# Patient Record
Sex: Female | Born: 1950
Health system: Southern US, Community
[De-identification: ages and names within clinical notes are randomized; demographics above are authoritative.]

## PROBLEM LIST (undated history)

## (undated) DIAGNOSIS — E119 Type 2 diabetes mellitus without complications: Secondary | ICD-10-CM

## (undated) DIAGNOSIS — K219 Gastro-esophageal reflux disease without esophagitis: Secondary | ICD-10-CM

## (undated) HISTORY — PX: EXPLORATORY LAPAROTOMY: SUR591

## (undated) HISTORY — PX: ABDOMINAL HYSTERECTOMY: SHX81

## (undated) HISTORY — PX: REPLACEMENT TOTAL KNEE BILATERAL: SUR1225

## (undated) HISTORY — PX: JOINT REPLACEMENT: SHX530

## (undated) HISTORY — PX: NEPHRECTOMY: SHX65

## (undated) HISTORY — PX: HERNIA REPAIR: SHX51

---

## 2004-11-17 ENCOUNTER — Ambulatory Visit: Payer: Self-pay | Admitting: Orthopaedic Surgery

## 2007-02-13 ENCOUNTER — Ambulatory Visit: Payer: Self-pay | Admitting: Orthopaedic Surgery

## 2007-02-14 ENCOUNTER — Ambulatory Visit: Payer: Self-pay | Admitting: Orthopaedic Surgery

## 2007-08-19 ENCOUNTER — Ambulatory Visit: Payer: Self-pay | Admitting: Orthopaedic Surgery

## 2007-08-27 ENCOUNTER — Inpatient Hospital Stay: Payer: Self-pay | Admitting: Orthopaedic Surgery

## 2008-06-27 IMAGING — CR DG KNEE 1-2V*R*
1 series · 2 of 2 positions shown · non-contrast
Comparison: none

REASON FOR EXAM: post op TKR
COMMENTS:   Bedside (portable):Y

[Series 1: view not recorded · 0.17mm/px · 2 of 2 slices shown]
[im 1/2]
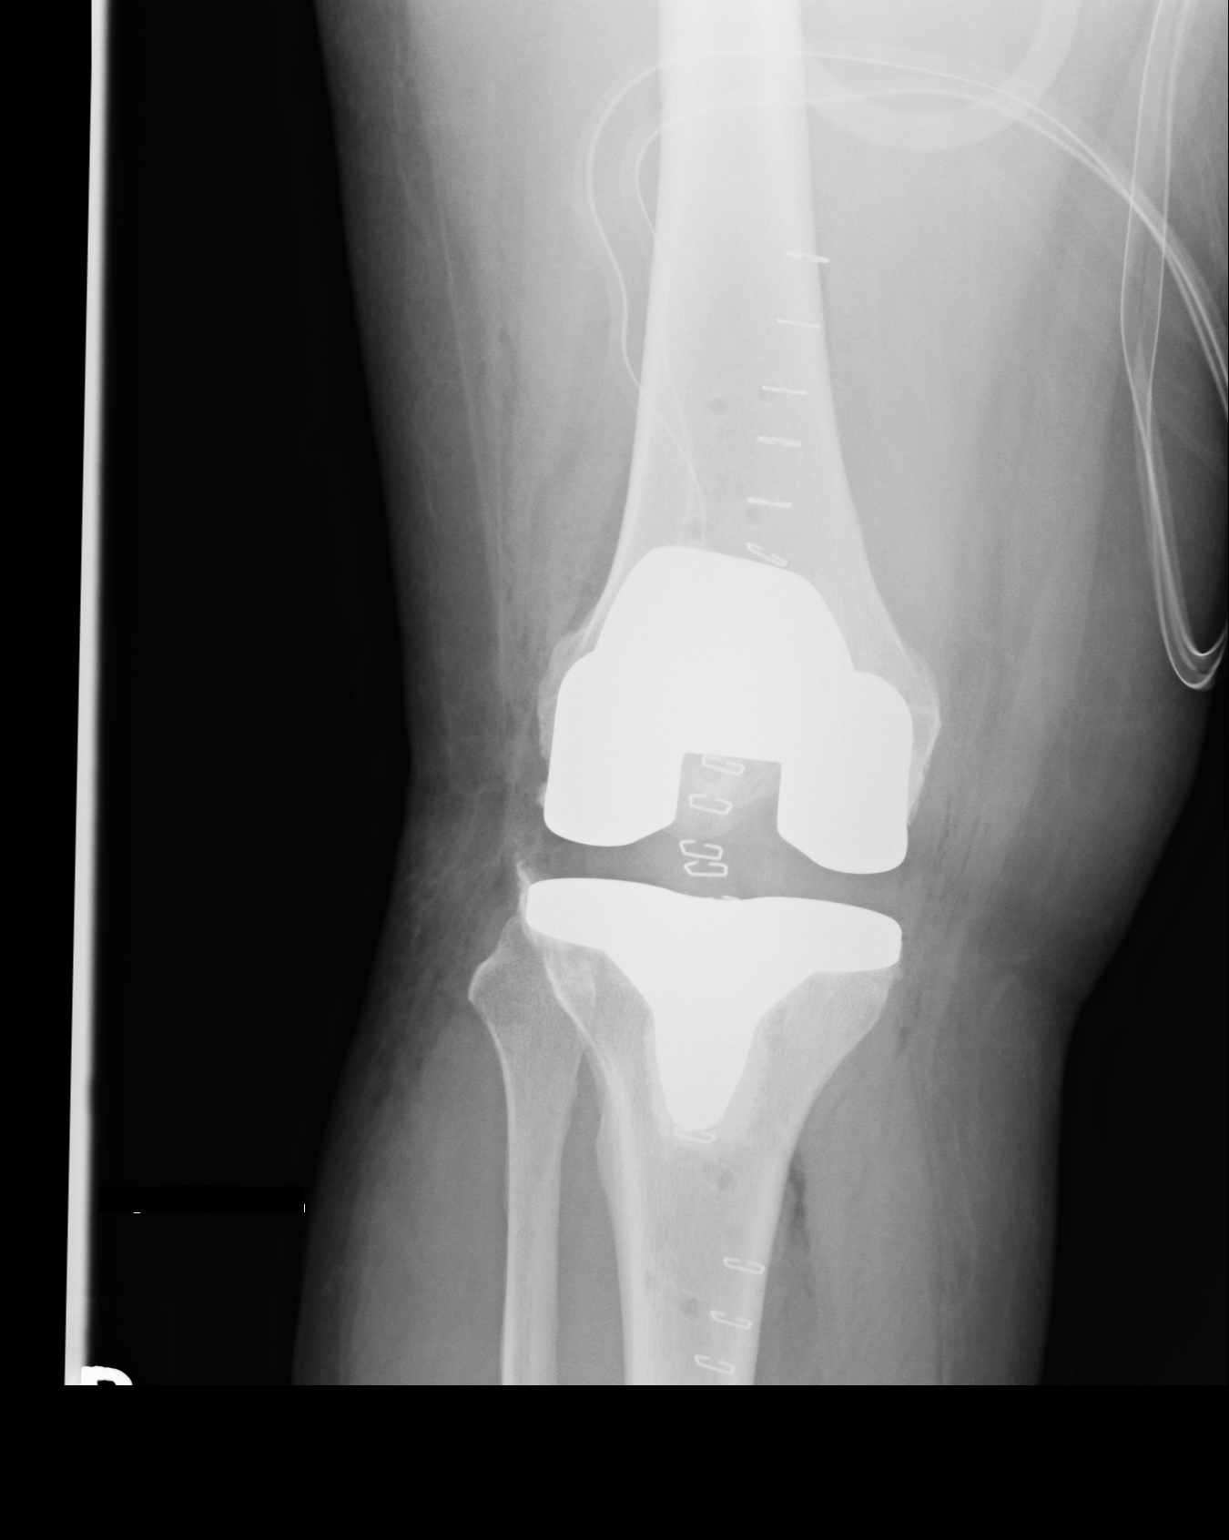
[im 2/2]
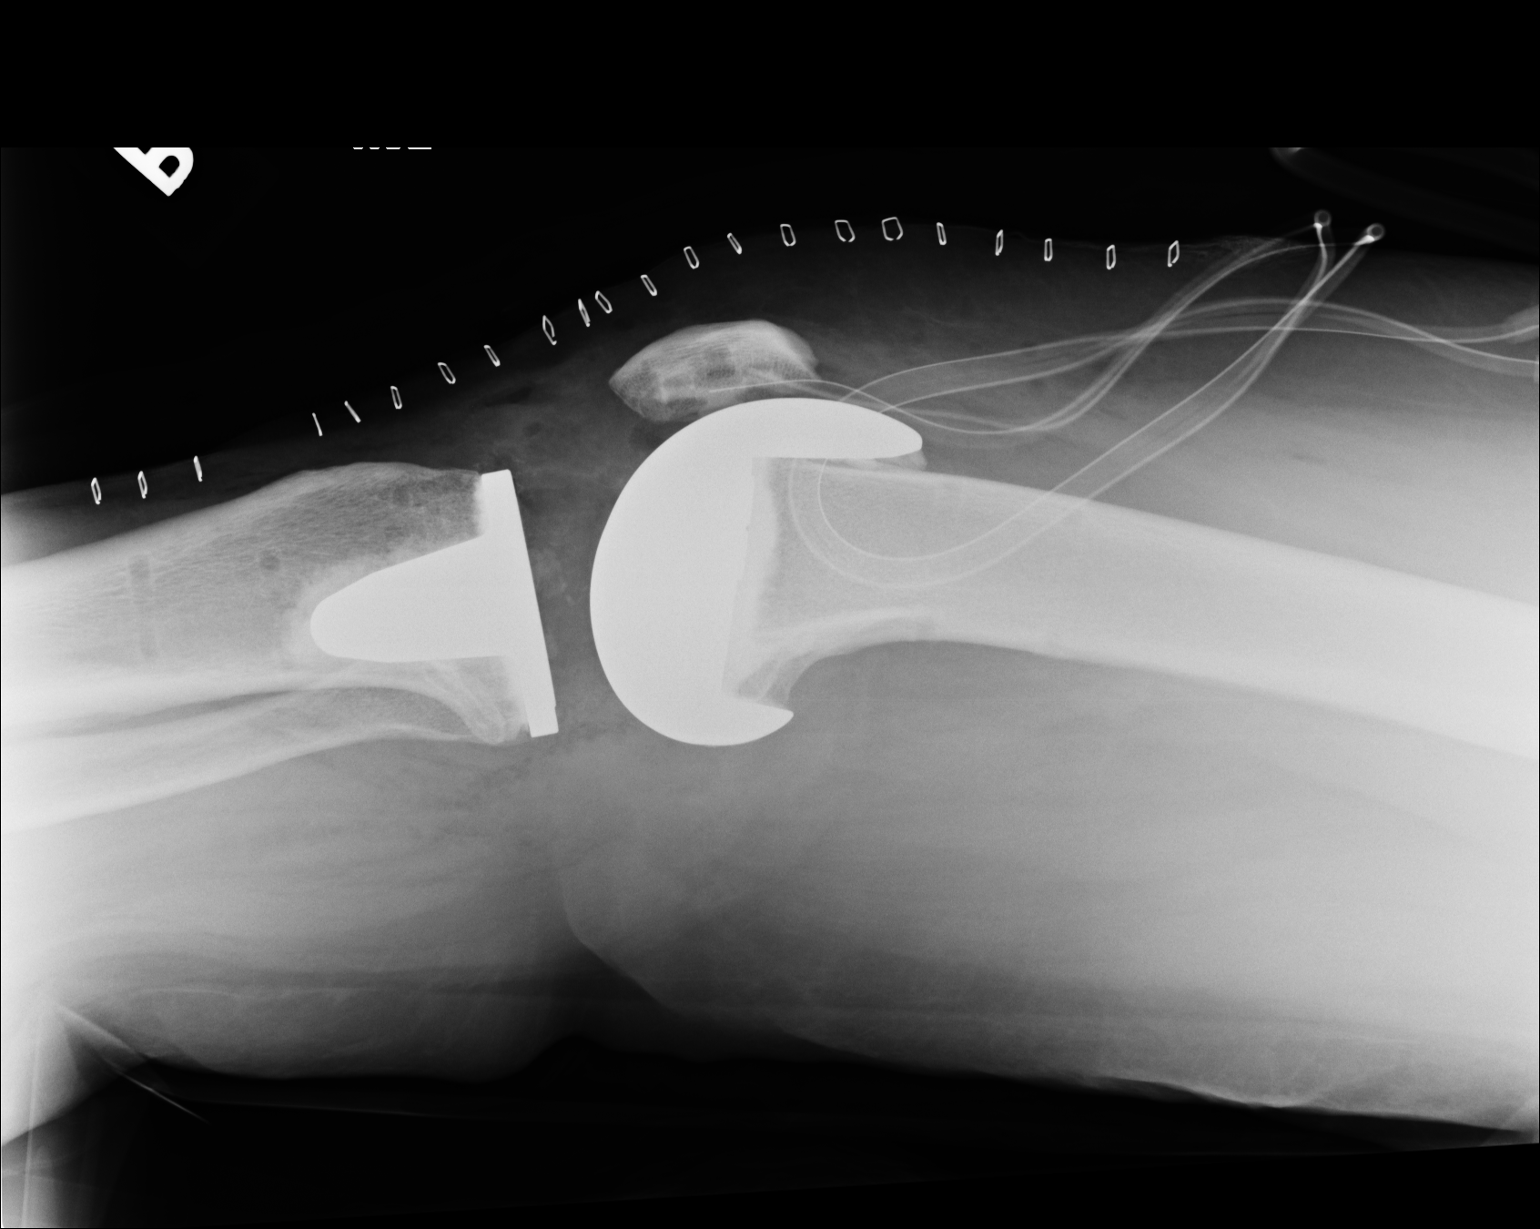

[2 of 2 positions shown; findings below may reference images not displayed]

PROCEDURE:     DXR - DXR KNEE RIGHT AP AND LATERAL  - August 27, 2007 [DATE]

RESULT:     The patient is status post RIGHT knee arthroplasty.  The femoral
and patellar components appear to be well seated without evidence of
loosening or failure. The native osseous structures are unremarkable.  Skin
staples and surgical drains are identified.
IMPRESSION: 1)The patient is status post RIGHT knee arthroplasty. The remainder of the
interpretation will be left to the performing physician.

## 2009-10-04 ENCOUNTER — Ambulatory Visit: Payer: Self-pay | Admitting: Unknown Physician Specialty

## 2013-01-07 ENCOUNTER — Ambulatory Visit: Payer: Self-pay | Admitting: Family Medicine

## 2013-12-09 DIAGNOSIS — E785 Hyperlipidemia, unspecified: Secondary | ICD-10-CM | POA: Insufficient documentation

## 2013-12-09 DIAGNOSIS — J45998 Other asthma: Secondary | ICD-10-CM | POA: Insufficient documentation

## 2013-12-09 DIAGNOSIS — E1169 Type 2 diabetes mellitus with other specified complication: Secondary | ICD-10-CM | POA: Insufficient documentation

## 2015-07-29 DIAGNOSIS — E7800 Pure hypercholesterolemia, unspecified: Secondary | ICD-10-CM | POA: Insufficient documentation

## 2017-04-17 DIAGNOSIS — Z905 Acquired absence of kidney: Secondary | ICD-10-CM | POA: Insufficient documentation

## 2017-05-01 DIAGNOSIS — Z96652 Presence of left artificial knee joint: Secondary | ICD-10-CM | POA: Insufficient documentation

## 2018-03-05 DIAGNOSIS — E113393 Type 2 diabetes mellitus with moderate nonproliferative diabetic retinopathy without macular edema, bilateral: Secondary | ICD-10-CM | POA: Diagnosis not present

## 2018-03-06 DIAGNOSIS — N951 Menopausal and female climacteric states: Secondary | ICD-10-CM | POA: Diagnosis not present

## 2018-03-06 DIAGNOSIS — Z1231 Encounter for screening mammogram for malignant neoplasm of breast: Secondary | ICD-10-CM | POA: Diagnosis not present

## 2018-03-06 DIAGNOSIS — Z01419 Encounter for gynecological examination (general) (routine) without abnormal findings: Secondary | ICD-10-CM | POA: Diagnosis not present

## 2018-03-06 DIAGNOSIS — Z6837 Body mass index (BMI) 37.0-37.9, adult: Secondary | ICD-10-CM | POA: Diagnosis not present

## 2018-03-19 DIAGNOSIS — E7800 Pure hypercholesterolemia, unspecified: Secondary | ICD-10-CM | POA: Diagnosis not present

## 2018-03-19 DIAGNOSIS — E119 Type 2 diabetes mellitus without complications: Secondary | ICD-10-CM | POA: Diagnosis not present

## 2018-03-26 DIAGNOSIS — R030 Elevated blood-pressure reading, without diagnosis of hypertension: Secondary | ICD-10-CM | POA: Diagnosis not present

## 2018-03-26 DIAGNOSIS — E6609 Other obesity due to excess calories: Secondary | ICD-10-CM | POA: Diagnosis not present

## 2018-03-26 DIAGNOSIS — E119 Type 2 diabetes mellitus without complications: Secondary | ICD-10-CM | POA: Diagnosis not present

## 2018-03-26 DIAGNOSIS — E7800 Pure hypercholesterolemia, unspecified: Secondary | ICD-10-CM | POA: Diagnosis not present

## 2018-05-30 DIAGNOSIS — Z09 Encounter for follow-up examination after completed treatment for conditions other than malignant neoplasm: Secondary | ICD-10-CM | POA: Diagnosis not present

## 2018-05-30 DIAGNOSIS — Z96652 Presence of left artificial knee joint: Secondary | ICD-10-CM | POA: Diagnosis not present

## 2018-07-11 DIAGNOSIS — Z96652 Presence of left artificial knee joint: Secondary | ICD-10-CM | POA: Diagnosis not present

## 2018-07-11 DIAGNOSIS — Z96651 Presence of right artificial knee joint: Secondary | ICD-10-CM | POA: Diagnosis not present

## 2018-07-15 DIAGNOSIS — C4441 Basal cell carcinoma of skin of scalp and neck: Secondary | ICD-10-CM | POA: Diagnosis not present

## 2018-07-15 DIAGNOSIS — D2271 Melanocytic nevi of right lower limb, including hip: Secondary | ICD-10-CM | POA: Diagnosis not present

## 2018-07-15 DIAGNOSIS — D2272 Melanocytic nevi of left lower limb, including hip: Secondary | ICD-10-CM | POA: Diagnosis not present

## 2018-07-15 DIAGNOSIS — D2262 Melanocytic nevi of left upper limb, including shoulder: Secondary | ICD-10-CM | POA: Diagnosis not present

## 2018-07-15 DIAGNOSIS — L821 Other seborrheic keratosis: Secondary | ICD-10-CM | POA: Diagnosis not present

## 2018-07-15 DIAGNOSIS — D485 Neoplasm of uncertain behavior of skin: Secondary | ICD-10-CM | POA: Diagnosis not present

## 2018-07-15 DIAGNOSIS — D2261 Melanocytic nevi of right upper limb, including shoulder: Secondary | ICD-10-CM | POA: Diagnosis not present

## 2018-08-13 DIAGNOSIS — Z1231 Encounter for screening mammogram for malignant neoplasm of breast: Secondary | ICD-10-CM | POA: Diagnosis not present

## 2018-09-05 DIAGNOSIS — C4441 Basal cell carcinoma of skin of scalp and neck: Secondary | ICD-10-CM | POA: Diagnosis not present

## 2018-09-19 DIAGNOSIS — E119 Type 2 diabetes mellitus without complications: Secondary | ICD-10-CM | POA: Diagnosis not present

## 2018-09-19 DIAGNOSIS — E7800 Pure hypercholesterolemia, unspecified: Secondary | ICD-10-CM | POA: Diagnosis not present

## 2018-09-19 DIAGNOSIS — R030 Elevated blood-pressure reading, without diagnosis of hypertension: Secondary | ICD-10-CM | POA: Diagnosis not present

## 2018-09-26 DIAGNOSIS — R030 Elevated blood-pressure reading, without diagnosis of hypertension: Secondary | ICD-10-CM | POA: Insufficient documentation

## 2018-09-26 DIAGNOSIS — E1169 Type 2 diabetes mellitus with other specified complication: Secondary | ICD-10-CM | POA: Diagnosis not present

## 2018-09-26 DIAGNOSIS — K219 Gastro-esophageal reflux disease without esophagitis: Secondary | ICD-10-CM | POA: Insufficient documentation

## 2018-09-26 DIAGNOSIS — E785 Hyperlipidemia, unspecified: Secondary | ICD-10-CM | POA: Diagnosis not present

## 2018-09-26 DIAGNOSIS — E7800 Pure hypercholesterolemia, unspecified: Secondary | ICD-10-CM | POA: Diagnosis not present

## 2018-09-26 DIAGNOSIS — E113393 Type 2 diabetes mellitus with moderate nonproliferative diabetic retinopathy without macular edema, bilateral: Secondary | ICD-10-CM | POA: Diagnosis not present

## 2018-09-26 DIAGNOSIS — E6609 Other obesity due to excess calories: Secondary | ICD-10-CM | POA: Diagnosis not present

## 2019-01-28 DIAGNOSIS — E1169 Type 2 diabetes mellitus with other specified complication: Secondary | ICD-10-CM | POA: Diagnosis not present

## 2019-01-28 DIAGNOSIS — E785 Hyperlipidemia, unspecified: Secondary | ICD-10-CM | POA: Diagnosis not present

## 2019-01-28 DIAGNOSIS — E7800 Pure hypercholesterolemia, unspecified: Secondary | ICD-10-CM | POA: Diagnosis not present

## 2019-02-03 DIAGNOSIS — Z23 Encounter for immunization: Secondary | ICD-10-CM | POA: Diagnosis not present

## 2019-02-03 DIAGNOSIS — Z0000 Encounter for general adult medical examination without abnormal findings: Secondary | ICD-10-CM | POA: Diagnosis not present

## 2019-02-03 DIAGNOSIS — E6609 Other obesity due to excess calories: Secondary | ICD-10-CM | POA: Diagnosis not present

## 2019-02-03 DIAGNOSIS — Z780 Asymptomatic menopausal state: Secondary | ICD-10-CM | POA: Diagnosis not present

## 2019-02-03 DIAGNOSIS — E7800 Pure hypercholesterolemia, unspecified: Secondary | ICD-10-CM | POA: Diagnosis not present

## 2019-02-03 DIAGNOSIS — E1169 Type 2 diabetes mellitus with other specified complication: Secondary | ICD-10-CM | POA: Diagnosis not present

## 2019-02-03 DIAGNOSIS — E785 Hyperlipidemia, unspecified: Secondary | ICD-10-CM | POA: Diagnosis not present

## 2019-06-12 DIAGNOSIS — Z780 Asymptomatic menopausal state: Secondary | ICD-10-CM | POA: Diagnosis not present

## 2019-06-12 DIAGNOSIS — E1169 Type 2 diabetes mellitus with other specified complication: Secondary | ICD-10-CM | POA: Diagnosis not present

## 2019-06-12 DIAGNOSIS — E785 Hyperlipidemia, unspecified: Secondary | ICD-10-CM | POA: Diagnosis not present

## 2019-06-12 DIAGNOSIS — E7800 Pure hypercholesterolemia, unspecified: Secondary | ICD-10-CM | POA: Diagnosis not present

## 2019-06-19 DIAGNOSIS — Z0000 Encounter for general adult medical examination without abnormal findings: Secondary | ICD-10-CM | POA: Diagnosis not present

## 2019-06-19 DIAGNOSIS — E1169 Type 2 diabetes mellitus with other specified complication: Secondary | ICD-10-CM | POA: Diagnosis not present

## 2019-06-19 DIAGNOSIS — E7800 Pure hypercholesterolemia, unspecified: Secondary | ICD-10-CM | POA: Diagnosis not present

## 2019-06-19 DIAGNOSIS — Z7689 Persons encountering health services in other specified circumstances: Secondary | ICD-10-CM | POA: Diagnosis not present

## 2019-06-19 DIAGNOSIS — E785 Hyperlipidemia, unspecified: Secondary | ICD-10-CM | POA: Diagnosis not present

## 2019-09-02 DIAGNOSIS — Z1231 Encounter for screening mammogram for malignant neoplasm of breast: Secondary | ICD-10-CM | POA: Diagnosis not present

## 2019-11-04 DIAGNOSIS — D2261 Melanocytic nevi of right upper limb, including shoulder: Secondary | ICD-10-CM | POA: Diagnosis not present

## 2019-11-04 DIAGNOSIS — D485 Neoplasm of uncertain behavior of skin: Secondary | ICD-10-CM | POA: Diagnosis not present

## 2019-11-04 DIAGNOSIS — D2271 Melanocytic nevi of right lower limb, including hip: Secondary | ICD-10-CM | POA: Diagnosis not present

## 2019-11-04 DIAGNOSIS — L821 Other seborrheic keratosis: Secondary | ICD-10-CM | POA: Diagnosis not present

## 2019-11-04 DIAGNOSIS — Z85828 Personal history of other malignant neoplasm of skin: Secondary | ICD-10-CM | POA: Diagnosis not present

## 2019-11-04 DIAGNOSIS — D2262 Melanocytic nevi of left upper limb, including shoulder: Secondary | ICD-10-CM | POA: Diagnosis not present

## 2019-11-04 DIAGNOSIS — L570 Actinic keratosis: Secondary | ICD-10-CM | POA: Diagnosis not present

## 2019-11-21 DIAGNOSIS — J4530 Mild persistent asthma, uncomplicated: Secondary | ICD-10-CM | POA: Diagnosis not present

## 2019-11-21 DIAGNOSIS — J3089 Other allergic rhinitis: Secondary | ICD-10-CM | POA: Diagnosis not present

## 2019-11-21 DIAGNOSIS — H1045 Other chronic allergic conjunctivitis: Secondary | ICD-10-CM | POA: Diagnosis not present

## 2019-12-09 DIAGNOSIS — L570 Actinic keratosis: Secondary | ICD-10-CM | POA: Diagnosis not present

## 2019-12-11 DIAGNOSIS — E785 Hyperlipidemia, unspecified: Secondary | ICD-10-CM | POA: Diagnosis not present

## 2019-12-11 DIAGNOSIS — E7800 Pure hypercholesterolemia, unspecified: Secondary | ICD-10-CM | POA: Diagnosis not present

## 2019-12-11 DIAGNOSIS — E1169 Type 2 diabetes mellitus with other specified complication: Secondary | ICD-10-CM | POA: Diagnosis not present

## 2019-12-18 DIAGNOSIS — M1611 Unilateral primary osteoarthritis, right hip: Secondary | ICD-10-CM | POA: Diagnosis not present

## 2019-12-18 DIAGNOSIS — M79605 Pain in left leg: Secondary | ICD-10-CM | POA: Diagnosis not present

## 2019-12-18 DIAGNOSIS — R030 Elevated blood-pressure reading, without diagnosis of hypertension: Secondary | ICD-10-CM | POA: Diagnosis not present

## 2019-12-18 DIAGNOSIS — E785 Hyperlipidemia, unspecified: Secondary | ICD-10-CM | POA: Diagnosis not present

## 2019-12-18 DIAGNOSIS — E7800 Pure hypercholesterolemia, unspecified: Secondary | ICD-10-CM | POA: Diagnosis not present

## 2019-12-18 DIAGNOSIS — M79604 Pain in right leg: Secondary | ICD-10-CM | POA: Diagnosis not present

## 2019-12-18 DIAGNOSIS — E6609 Other obesity due to excess calories: Secondary | ICD-10-CM | POA: Diagnosis not present

## 2019-12-18 DIAGNOSIS — E1169 Type 2 diabetes mellitus with other specified complication: Secondary | ICD-10-CM | POA: Diagnosis not present

## 2019-12-18 DIAGNOSIS — M1612 Unilateral primary osteoarthritis, left hip: Secondary | ICD-10-CM | POA: Diagnosis not present

## 2020-01-07 DIAGNOSIS — M19071 Primary osteoarthritis, right ankle and foot: Secondary | ICD-10-CM | POA: Diagnosis not present

## 2020-01-07 DIAGNOSIS — M25571 Pain in right ankle and joints of right foot: Secondary | ICD-10-CM | POA: Diagnosis not present

## 2020-01-07 DIAGNOSIS — M25471 Effusion, right ankle: Secondary | ICD-10-CM | POA: Diagnosis not present

## 2020-02-03 DIAGNOSIS — E113393 Type 2 diabetes mellitus with moderate nonproliferative diabetic retinopathy without macular edema, bilateral: Secondary | ICD-10-CM | POA: Diagnosis not present

## 2020-02-12 DIAGNOSIS — Z96653 Presence of artificial knee joint, bilateral: Secondary | ICD-10-CM | POA: Diagnosis not present

## 2020-02-12 DIAGNOSIS — M24661 Ankylosis, right knee: Secondary | ICD-10-CM | POA: Diagnosis not present

## 2020-02-16 DIAGNOSIS — N951 Menopausal and female climacteric states: Secondary | ICD-10-CM | POA: Insufficient documentation

## 2020-02-16 DIAGNOSIS — E669 Obesity, unspecified: Secondary | ICD-10-CM | POA: Insufficient documentation

## 2020-03-10 ENCOUNTER — Encounter
Admission: RE | Admit: 2020-03-10 | Discharge: 2020-03-10 | Disposition: A | Discharge status: Home / Self Care | Payer: PPO | Source: Ambulatory Visit | Attending: Orthopedic Surgery | Admitting: Orthopedic Surgery

## 2020-03-10 ENCOUNTER — Other Ambulatory Visit: Payer: Self-pay

## 2020-03-10 DIAGNOSIS — Z01818 Encounter for other preprocedural examination: Secondary | ICD-10-CM | POA: Diagnosis present

## 2020-03-10 HISTORY — DX: Type 2 diabetes mellitus without complications: E11.9

## 2020-03-10 HISTORY — DX: Gastro-esophageal reflux disease without esophagitis: K21.9

## 2020-03-10 NOTE — Patient Instructions (Addendum)
Your procedure is scheduled on: Wednesday 03/17/20.  Report to DAY SURGERY DEPARTMENT LOCATED ON 2ND FLOOR MEDICAL MALL ENTRANCE. To find out your arrival time please call [phone] between 1PM - 3PM on Tuesday 03/16/20.   Remember: Instructions that are not followed completely may result in serious medical risk, up to and including death, or upon the discretion of your surgeon and anesthesiologist your surgery may need to be rescheduled.     __X__ 1. Do not eat food after midnight the night before your procedure.                 No gum chewing or hard candies. You may drink SUGAR FREE clear liquids up to 2 hours                 before you are scheduled to arrive for your surgery- DO NOT drink clear                 liquids within 2 hours of the start of your surgery.  ** Dr. Marry Guan would like for you to finish the G2 Gatorade 2 hours prior to your arrival on the morning of your surgery. **                  __X__2.  On the morning of surgery brush your teeth with toothpaste and water, you may rinse your mouth with mouthwash if you wish.  Do not swallow any toothpaste or mouthwash.    __X__ 3.  No Alcohol for 24 hours before or after surgery.  __X__ 4.  Do Not Smoke or use e-cigarettes For 24 Hours Prior to Your Surgery.                 Do not use any chewable tobacco products for at least 6 hours prior to                 surgery.  __X__5.  Notify your doctor if there is any change in your medical condition      (cold, fever, infections).      Do NOT wear jewelry, make-up, hairpins, clips or nail polish. Do NOT wear lotions, powders, or perfumes.  Do NOT shave 48 hours prior to surgery. Men may shave face and neck. Do NOT bring valuables to the hospital.     [facility] is not responsible for any belongings or valuables.   Contacts, dentures/partials or body piercings may not be worn into surgery. Bring a case for your contacts, glasses or hearing aids, a denture cup will be  supplied.   Patients discharged the day of surgery will not be allowed to drive home.     __X__ Take these medicines the morning of surgery with A SIP OF WATER:     1. omeprazole (PRILOSEC)      __X__ Use CHG Soap as directed  __X__ Stop Metformin 2 days prior to surgery.Your last dose will be on Sunday evening 03/14/20.    __X__ Stop Blood Thinners: Aspirin today.  __X__ Stop Anti-inflammatories 7 days before surgery such as Advil, Ibuprofen, Motrin, BC or Goodies Powder, Naprosyn, Naproxen, Aleve, Aspirin, Meloxicam. May take Tylenol if needed for pain or discomfort.   __X__Do not start taking any new herbal supplements or vitamins prior to your procedure.  __X__ Stop the following herbal supplements or vitamins:  OSTEO BI-FLEX    Wear comfortable clothing (specific to your surgery type) to the hospital.  Plan for stool softeners for home use;  pain medications have a tendency to cause constipation. You can also help prevent constipation by eating foods high in fiber such as fruits and vegetables and drinking plenty of fluids as your diet allows.  After surgery, you can prevent lung complications by doing breathing exercises.Take deep breaths and cough every 1-2 hours. Your doctor may order a device called an Incentive Spirometer to help you take deep breaths.  Please call the [place] [facility] Department at [phone] if you have any questions about these instructions.

## 2020-03-11 ENCOUNTER — Encounter
Admission: RE | Admit: 2020-03-11 | Discharge: 2020-03-11 | Disposition: A | Discharge status: Home / Self Care | Payer: PPO | Source: Ambulatory Visit | Attending: Orthopedic Surgery | Admitting: Orthopedic Surgery

## 2020-03-11 DIAGNOSIS — E119 Type 2 diabetes mellitus without complications: Secondary | ICD-10-CM | POA: Insufficient documentation

## 2020-03-11 DIAGNOSIS — Z01818 Encounter for other preprocedural examination: Secondary | ICD-10-CM | POA: Diagnosis not present

## 2020-03-11 LAB — SURGICAL PCR SCREEN
MRSA, PCR: NEGATIVE
Staphylococcus aureus: NEGATIVE

## 2020-03-15 ENCOUNTER — Other Ambulatory Visit: Payer: Self-pay

## 2020-03-15 ENCOUNTER — Other Ambulatory Visit
Admission: RE | Admit: 2020-03-15 | Discharge: 2020-03-15 | Disposition: A | Discharge status: Home / Self Care | Payer: PPO | Source: Ambulatory Visit | Attending: Orthopedic Surgery | Admitting: Orthopedic Surgery

## 2020-03-15 DIAGNOSIS — Z01812 Encounter for preprocedural laboratory examination: Secondary | ICD-10-CM | POA: Insufficient documentation

## 2020-03-15 DIAGNOSIS — Z20822 Contact with and (suspected) exposure to covid-19: Secondary | ICD-10-CM | POA: Insufficient documentation

## 2020-03-15 LAB — SARS CORONAVIRUS 2 (TAT 6-24 HRS): SARS Coronavirus 2: NEGATIVE

## 2020-03-17 ENCOUNTER — Ambulatory Visit: Payer: PPO | Admitting: Certified Registered"

## 2020-03-17 ENCOUNTER — Encounter: Payer: Self-pay | Admitting: Orthopedic Surgery

## 2020-03-17 ENCOUNTER — Encounter
Admission: RE | Disposition: A | Discharge status: Home / Self Care | Payer: Self-pay | Source: Home / Self Care | Attending: Orthopedic Surgery

## 2020-03-17 ENCOUNTER — Other Ambulatory Visit: Payer: Self-pay

## 2020-03-17 ENCOUNTER — Ambulatory Visit
Admission: RE | Admit: 2020-03-17 | Discharge: 2020-03-17 | Disposition: A | Discharge status: Home / Self Care | Payer: PPO | Attending: Orthopedic Surgery | Admitting: Orthopedic Surgery

## 2020-03-17 DIAGNOSIS — Z96651 Presence of right artificial knee joint: Secondary | ICD-10-CM | POA: Diagnosis not present

## 2020-03-17 DIAGNOSIS — M2341 Loose body in knee, right knee: Secondary | ICD-10-CM | POA: Diagnosis not present

## 2020-03-17 DIAGNOSIS — R7303 Prediabetes: Secondary | ICD-10-CM | POA: Insufficient documentation

## 2020-03-17 DIAGNOSIS — M24661 Ankylosis, right knee: Secondary | ICD-10-CM | POA: Insufficient documentation

## 2020-03-17 DIAGNOSIS — Z7982 Long term (current) use of aspirin: Secondary | ICD-10-CM | POA: Insufficient documentation

## 2020-03-17 DIAGNOSIS — Z905 Acquired absence of kidney: Secondary | ICD-10-CM | POA: Insufficient documentation

## 2020-03-17 DIAGNOSIS — Z7984 Long term (current) use of oral hypoglycemic drugs: Secondary | ICD-10-CM | POA: Diagnosis not present

## 2020-03-17 DIAGNOSIS — Z96653 Presence of artificial knee joint, bilateral: Secondary | ICD-10-CM | POA: Diagnosis not present

## 2020-03-17 DIAGNOSIS — Z79899 Other long term (current) drug therapy: Secondary | ICD-10-CM | POA: Diagnosis not present

## 2020-03-17 DIAGNOSIS — E7800 Pure hypercholesterolemia, unspecified: Secondary | ICD-10-CM | POA: Diagnosis not present

## 2020-03-17 DIAGNOSIS — Z87891 Personal history of nicotine dependence: Secondary | ICD-10-CM | POA: Diagnosis not present

## 2020-03-17 DIAGNOSIS — J302 Other seasonal allergic rhinitis: Secondary | ICD-10-CM | POA: Insufficient documentation

## 2020-03-17 DIAGNOSIS — E119 Type 2 diabetes mellitus without complications: Secondary | ICD-10-CM | POA: Diagnosis not present

## 2020-03-17 DIAGNOSIS — E669 Obesity, unspecified: Secondary | ICD-10-CM | POA: Diagnosis not present

## 2020-03-17 DIAGNOSIS — K219 Gastro-esophageal reflux disease without esophagitis: Secondary | ICD-10-CM | POA: Diagnosis not present

## 2020-03-17 DIAGNOSIS — Z6837 Body mass index (BMI) 37.0-37.9, adult: Secondary | ICD-10-CM | POA: Diagnosis not present

## 2020-03-17 DIAGNOSIS — Z9889 Other specified postprocedural states: Secondary | ICD-10-CM

## 2020-03-17 HISTORY — PX: KNEE ARTHROSCOPY: SHX127

## 2020-03-17 LAB — GLUCOSE, CAPILLARY
Glucose-Capillary: 122 mg/dL — ABNORMAL HIGH (ref 70–99)
Glucose-Capillary: 158 mg/dL — ABNORMAL HIGH (ref 70–99)

## 2020-03-17 SURGERY — ARTHROSCOPY, KNEE
Anesthesia: General | Site: Knee | Laterality: Right

## 2020-03-17 MED ORDER — SODIUM CHLORIDE 0.9 % IV SOLN: INTRAVENOUS | Status: DC

## 2020-03-17 MED ORDER — FENTANYL CITRATE (PF) 100 MCG/2ML IJ SOLN
INTRAMUSCULAR | Status: AC
  Filled 2020-03-17: qty 2

## 2020-03-17 MED ORDER — CHLORHEXIDINE GLUCONATE 0.12 % MT SOLN: 15.0000 mL | Freq: Once | OROMUCOSAL | Status: AC

## 2020-03-17 MED ORDER — MIDAZOLAM HCL 2 MG/2ML IJ SOLN
INTRAMUSCULAR | Status: AC
  Filled 2020-03-17: qty 2

## 2020-03-17 MED ORDER — FENTANYL CITRATE (PF) 100 MCG/2ML IJ SOLN
INTRAMUSCULAR | Status: DC | PRN
  Administered 2020-03-17: 50 ug via INTRAVENOUS
  Administered 2020-03-17: 25 ug via INTRAVENOUS
  Administered 2020-03-17: 50 ug via INTRAVENOUS

## 2020-03-17 MED ORDER — PROMETHAZINE HCL 25 MG/ML IJ SOLN: 6.2500 mg | INTRAMUSCULAR | Status: DC | PRN

## 2020-03-17 MED ORDER — SEVOFLURANE IN SOLN
RESPIRATORY_TRACT | Status: AC
  Filled 2020-03-17: qty 250

## 2020-03-17 MED ORDER — BUPIVACAINE-EPINEPHRINE (PF) 0.25% -1:200000 IJ SOLN
INTRAMUSCULAR | Status: AC
  Filled 2020-03-17: qty 30

## 2020-03-17 MED ORDER — DEXAMETHASONE SODIUM PHOSPHATE 10 MG/ML IJ SOLN
INTRAMUSCULAR | Status: AC
  Filled 2020-03-17: qty 1

## 2020-03-17 MED ORDER — CEFAZOLIN SODIUM-DEXTROSE 2-4 GM/100ML-% IV SOLN
INTRAVENOUS | Status: AC
  Filled 2020-03-17: qty 100

## 2020-03-17 MED ORDER — ACETAMINOPHEN 10 MG/ML IV SOLN
INTRAVENOUS | Status: AC
  Filled 2020-03-17: qty 100

## 2020-03-17 MED ORDER — DEXAMETHASONE SODIUM PHOSPHATE 10 MG/ML IJ SOLN
INTRAMUSCULAR | Status: DC | PRN
  Administered 2020-03-17: 5 mg via INTRAVENOUS

## 2020-03-17 MED ORDER — HYDROCODONE-ACETAMINOPHEN 5-325 MG PO TABS
ORAL_TABLET | ORAL | Status: DC
Start: 2020-03-17 — End: 2020-03-18
  Filled 2020-03-17: qty 1

## 2020-03-17 MED ORDER — ACETAMINOPHEN 10 MG/ML IV SOLN
INTRAVENOUS | Status: DC | PRN
  Administered 2020-03-17: 1000 mg via INTRAVENOUS

## 2020-03-17 MED ORDER — HYDROCODONE-ACETAMINOPHEN 5-325 MG PO TABS
1.0000 | ORAL_TABLET | ORAL | Status: DC | PRN
  Administered 2020-03-17: 1 via ORAL

## 2020-03-17 MED ORDER — CEFAZOLIN SODIUM-DEXTROSE 2-4 GM/100ML-% IV SOLN
2.0000 g | INTRAVENOUS | Status: AC
  Administered 2020-03-17: 2 g via INTRAVENOUS

## 2020-03-17 MED ORDER — BUPIVACAINE-EPINEPHRINE 0.25% -1:200000 IJ SOLN
INTRAMUSCULAR | Status: DC | PRN
  Administered 2020-03-17: 25 mL
  Administered 2020-03-17: 5 mL

## 2020-03-17 MED ORDER — CHLORHEXIDINE GLUCONATE 0.12 % MT SOLN
OROMUCOSAL | Status: AC
  Administered 2020-03-17: 15 mL via OROMUCOSAL
  Filled 2020-03-17: qty 15

## 2020-03-17 MED ORDER — LIDOCAINE HCL (PF) 2 % IJ SOLN
INTRAMUSCULAR | Status: AC
  Filled 2020-03-17: qty 5

## 2020-03-17 MED ORDER — HYDROCODONE-ACETAMINOPHEN 5-325 MG PO TABS: 1.0000 | ORAL_TABLET | ORAL | 0 refills | Status: AC | PRN

## 2020-03-17 MED ORDER — METOCLOPRAMIDE HCL 10 MG PO TABS: 5.0000 mg | ORAL_TABLET | Freq: Three times a day (TID) | ORAL | Status: DC | PRN

## 2020-03-17 MED ORDER — MORPHINE SULFATE (PF) 2 MG/ML IV SOLN
INTRAVENOUS | Status: AC
  Filled 2020-03-17: qty 1

## 2020-03-17 MED ORDER — SODIUM CHLORIDE 0.9 % IV SOLN
INTRAVENOUS | Status: DC | PRN
  Administered 2020-03-17: 25 ug/min via INTRAVENOUS

## 2020-03-17 MED ORDER — ONDANSETRON HCL 4 MG/2ML IJ SOLN
INTRAMUSCULAR | Status: DC | PRN
  Administered 2020-03-17: 4 mg via INTRAVENOUS

## 2020-03-17 MED ORDER — CELECOXIB 200 MG PO CAPS: 400.0000 mg | ORAL_CAPSULE | Freq: Once | ORAL | Status: AC

## 2020-03-17 MED ORDER — METOCLOPRAMIDE HCL 5 MG/ML IJ SOLN: 5.0000 mg | Freq: Three times a day (TID) | INTRAMUSCULAR | Status: DC | PRN

## 2020-03-17 MED ORDER — FENTANYL CITRATE (PF) 100 MCG/2ML IJ SOLN: 25.0000 ug | INTRAMUSCULAR | Status: DC | PRN

## 2020-03-17 MED ORDER — ONDANSETRON HCL 4 MG/2ML IJ SOLN
INTRAMUSCULAR | Status: AC
  Filled 2020-03-17: qty 2

## 2020-03-17 MED ORDER — CELECOXIB 200 MG PO CAPS
ORAL_CAPSULE | ORAL | Status: AC
  Administered 2020-03-17: 400 mg via ORAL
  Filled 2020-03-17: qty 2

## 2020-03-17 MED ORDER — LIDOCAINE HCL (CARDIAC) PF 100 MG/5ML IV SOSY
PREFILLED_SYRINGE | INTRAVENOUS | Status: DC | PRN
  Administered 2020-03-17: 80 mg via INTRAVENOUS

## 2020-03-17 MED ORDER — MORPHINE SULFATE 4 MG/ML IJ SOLN
INTRAMUSCULAR | Status: DC | PRN
  Administered 2020-03-17: 4 mg via INTRAVENOUS

## 2020-03-17 MED ORDER — ONDANSETRON HCL 4 MG PO TABS: 4.0000 mg | ORAL_TABLET | Freq: Four times a day (QID) | ORAL | Status: DC | PRN

## 2020-03-17 MED ORDER — PHENYLEPHRINE HCL (PRESSORS) 10 MG/ML IV SOLN
INTRAVENOUS | Status: DC | PRN
  Administered 2020-03-17 (×4): 100 ug via INTRAVENOUS

## 2020-03-17 MED ORDER — MIDAZOLAM HCL 2 MG/2ML IJ SOLN
INTRAMUSCULAR | Status: DC | PRN
  Administered 2020-03-17: 2 mg via INTRAVENOUS

## 2020-03-17 MED ORDER — ONDANSETRON HCL 4 MG/2ML IJ SOLN: 4.0000 mg | Freq: Four times a day (QID) | INTRAMUSCULAR | Status: DC | PRN

## 2020-03-17 MED ORDER — ORAL CARE MOUTH RINSE: 15.0000 mL | Freq: Once | OROMUCOSAL | Status: AC

## 2020-03-17 MED ORDER — PROPOFOL 10 MG/ML IV BOLUS
INTRAVENOUS | Status: DC | PRN
  Administered 2020-03-17: 150 mg via INTRAVENOUS
  Administered 2020-03-17: 50 mg via INTRAVENOUS

## 2020-03-17 MED ORDER — MORPHINE SULFATE (PF) 2 MG/ML IV SOLN
INTRAVENOUS | Status: AC
  Filled 2020-03-17: qty 2

## 2020-03-17 SURGICAL SUPPLY — 31 items
ADAPTER IRRIG TUBE 2 SPIKE SOL (ADAPTER) ×6 IMPLANT
ADPR TBG 2 SPK PMP STRL ASCP (ADAPTER) ×2
BLADE SHAVER 4.5 DBL SERAT CV (CUTTER) IMPLANT
COVER WAND RF STERILE (DRAPES) ×3 IMPLANT
CUFF TOURN SGL QUICK 24 (TOURNIQUET CUFF) ×3
CUFF TOURN SGL QUICK 30 (TOURNIQUET CUFF)
CUFF TRNQT CYL 24X4X16.5-23 (TOURNIQUET CUFF) IMPLANT
CUFF TRNQT CYL 30X4X21-28X (TOURNIQUET CUFF) IMPLANT
DRSG DERMACEA 8X12 NADH (GAUZE/BANDAGES/DRESSINGS) ×3 IMPLANT
DURAPREP 26ML APPLICATOR (WOUND CARE) ×6 IMPLANT
GAUZE SPONGE 4X4 12PLY STRL (GAUZE/BANDAGES/DRESSINGS) ×3 IMPLANT
GLOVE BIOGEL M STRL SZ7.5 (GLOVE) ×3 IMPLANT
GLOVE INDICATOR 8.0 STRL GRN (GLOVE) ×3 IMPLANT
GOWN STRL REUS W/ TWL LRG LVL3 (GOWN DISPOSABLE) ×2 IMPLANT
GOWN STRL REUS W/TWL LRG LVL3 (GOWN DISPOSABLE) ×6
IV LACTATED RINGER IRRG 3000ML (IV SOLUTION) ×39
IV LR IRRIG 3000ML ARTHROMATIC (IV SOLUTION) ×6 IMPLANT
KIT TURNOVER KIT A (KITS) ×3 IMPLANT
MANIFOLD NEPTUNE II (INSTRUMENTS) ×3 IMPLANT
PACK KNEE ARTHRO (MISCELLANEOUS) ×3 IMPLANT
SET TUBE SUCT SHAVER OUTFL 24K (TUBING) ×3 IMPLANT
SET TUBE TIP INTRA-ARTICULAR (MISCELLANEOUS) ×3 IMPLANT
SOL PREP PVP 2OZ (MISCELLANEOUS) ×3
SOLUTION PREP PVP 2OZ (MISCELLANEOUS) ×1 IMPLANT
SUT ETHILON 3-0 FS-10 30 BLK (SUTURE) ×3
SUTURE EHLN 3-0 FS-10 30 BLK (SUTURE) ×1 IMPLANT
TUBING ARTHRO INFLOW-ONLY STRL (TUBING) ×3 IMPLANT
WAND 4.6 50D HIPVAC 50 IFS (SURGICAL WAND) ×2 IMPLANT
WAND HAND CNTRL MULTIVAC 50 (MISCELLANEOUS) ×1 IMPLANT
WIRE Z .035 C-WIRE SPADE TIP (WIRE) ×2 IMPLANT
WRAP KNEE W/COLD PACKS 25.5X14 (SOFTGOODS) ×3 IMPLANT

## 2020-03-17 NOTE — Anesthesia Procedure Notes (Signed)
Procedure Name: LMA Insertion Date/Time: 03/17/2020 5:09 PM Performed by: Lerry Liner, CRNA Pre-anesthesia Checklist: Patient identified, Emergency Drugs available, Suction available, Patient being monitored and Timeout performed Patient Re-evaluated:Patient Re-evaluated prior to induction Oxygen Delivery Method: Circle system utilized Preoxygenation: Pre-oxygenation with 100% oxygen Induction Type: IV induction Ventilation: Mask ventilation without difficulty LMA: LMA inserted LMA Size: 4.0 Number of attempts: 1 Tube secured with: Tape Dental Injury: Teeth and Oropharynx as per pre-operative assessment

## 2020-03-17 NOTE — Transfer of Care (Signed)
Immediate Anesthesia Transfer of Care Note  Patient: Jordan Page  Procedure(s) Performed: RIGHT KNEE ARTHROSCOPY WITH WRTHROSCOPIC LYSIS OF ADHESIONS (Right Knee)  Patient Location: PACU  Anesthesia Type:General  Level of Consciousness: awake, alert , oriented and patient cooperative  Airway & Oxygen Therapy: Patient Spontanous Breathing and Patient connected to face mask oxygen  Post-op Assessment: Report given to RN and Post -op Vital signs reviewed and stable  Post vital signs: Reviewed and stable  Last Vitals:  Vitals Value Taken Time  BP 135/81 03/17/20 1941  Temp 36.3 C 03/17/20 1941  Pulse 95 03/17/20 1945  Resp 21 03/17/20 1945  SpO2 98 % 03/17/20 1945  Vitals shown include unvalidated device data.  Last Pain:  Vitals:   03/17/20 1941  TempSrc:   PainSc: 0-No pain         Complications: No complications documented.

## 2020-03-17 NOTE — Op Note (Signed)
OPERATIVE NOTE  DATE OF SURGERY:  03/17/2020  PATIENT NAME:  Jordan Page   DOB: 11-Nov-1950  MRN: [id number]   PRE-OPERATIVE DIAGNOSIS: Arthrofibrosis of the right knee status post right total knee arthroplasty  POST-OPERATIVE DIAGNOSIS:  Same  PROCEDURE: Right knee arthroscopy, arthroscopic lysis of adhesions, and removal of polyethylene loose body  SURGEON:  Marciano Sequin., M.D.   ASSISTANT: None  ANESTHESIA: general  ESTIMATED BLOOD LOSS: Minimal  FLUIDS REPLACED: 900 mL of crystalloid  TOURNIQUET TIME: Not used  INDICATIONS FOR SURGERY: Jordan Page is a 69 y.o. year old female who has a history of a remote right total knee arthroplasty performed by Dr. Gerald Stabs.  She has had progressive difficulty with loss of her range of motion that is beginning to affect her gait.  Radiographs did not demonstrate any evidence of loosening.. After discussion of the risks and benefits of surgical intervention, the patient expressed understanding of the risks benefits and agree with plans for right knee arthroscopy and lysis of adhesions.   PROCEDURE IN DETAIL: The patient was brought into the operating room and, after adequate general anesthesia was achieved, tourniquet was placed on the patient's upper right thigh.  Leg was placed in leg holder and all bony prominences were well-padded.  The patient's right knee and leg were cleaned and prepped with alcohol and DuraPrep and draped in usual sterile fashion.  A "timeout" was performed as per usual protocol.  The anticipated portal sites were injected with 0.25% Marcaine with epinephrine.  An anterolateral portal was created and a cannula was inserted.  The scope was inserted it was difficult to delineate the implants due to the degree of fibrotic tissue in the infrapatellar space.  An anteromedial portal was created and a 4.5 mm incisor shaver was inserted.  Using triangulation techniques, the end of the shaver was visualized and some of the  dense fibrotic tissue was debrided.  Enough of the fibrotic tissue was removed with the shaver so as to begin to visualize the intercondylar notch and the post of the polyethylene.  Next, the ArthroCare wand was inserted and additional debridement of the tissue was performed taking care to protect the implants.  Debridement was continued to the medial aspect of the polyethylene and then up the medial gutter.  Again, the gutter was completely encased with dense fibrotic tissue.  Debridement involves alternating between the ArthroCare wand and the shaver.  The medial gutter was adequately reestablished allowing visualization of the patella and the suprapatellar pouch.  Dissection was continued along the suprapatellar pouch.  Next, the visualization was focused to the anterior aspect of the implant.  While working on debridement of some of the fibrotic tissue, a polyethylene Was encountered embedded in the tissue.  This was removed using pituitary forceps.  The piece appeared to be from the plug hole of the femoral component.  Dissection was continued towards the lateral gutter.  The scope was removed from the anterolateral portal and reinserted via the anteromedial portal.  The lateral gutter was then debrided using combination of the ArthroCare wand and the 4.5 mm shaver.  The gutter was reestablished in this fashion with hemostasis achieved using the wand.  Debridement was continued all the way up to the suprapatellar pouch.  Additional attention was directed to the pouch with marked improvement of the volume of the pouch.  The knee was irrigated with copious amounts of fluid and then suctioned dry.  The anterolateral portal was reapproximated using #3-0 nylon.  A combination of 0.25% Marcaine with epinephrine and 4 mg of morphine was injected via the scope.  The scope was removed and the anteromedial portal was reapproximated.  A sterile dressing was applied.  Slightly greater than 90 degrees of flexion was  achieved even with the bulky dressing in place.  The patient tolerated the procedure well.  She was transported to the recovery room in stable condition.   Arnold Kester P. Holley Bouche M.D.

## 2020-03-17 NOTE — Discharge Instructions (Signed)
Instructions after Knee Arthroscopy    James P. Hooten, Jr., M.D.     Dept. of Orthopaedics & Sports Medicine  [facility]  [address]  [place], [place]  [address]   Phone: [phone]   Fax: [phone]   DIET: . Drink plenty of non-alcoholic fluids & begin a light diet. . Resume your normal diet the day after surgery.  ACTIVITY:  . You may use crutches or a walker with weight-bearing as tolerated, unless instructed otherwise. . You may wean yourself off of the walker or crutches as tolerated.  . Begin doing gentle exercises. Exercising will reduce the pain and swelling, increase motion, and prevent muscle weakness.   . Avoid strenuous activities or athletics for a minimum of 4-6 weeks after arthroscopic surgery. . Do not drive or operate any equipment until instructed.  WOUND CARE:  . Place one to two pillows under the knee the first day or two when sitting or lying.  . Continue to use the ice packs periodically to reduce pain and swelling. . The small incisions in your knee are closed with nylon stitches. The stitches will be removed in the office. . The bulky dressing may be removed on the second day after surgery. DO NOT TOUCH THE STITCHES. Put a Band-Aid over each stitch. Do NOT use any ointments or creams on the incisions.  . You may bathe or shower after the stitches are removed at the first office visit following surgery.  MEDICATIONS: . You may resume your regular medications. . Please take the pain medication as prescribed. . Do not take pain medication on an empty stomach. . Do not drive or drink alcoholic beverages when taking pain medications.  CALL THE OFFICE FOR: . Temperature above 101 degrees . Excessive bleeding or drainage on the dressing. . Excessive swelling, coldness, or paleness of the toes. . Persistent nausea and vomiting.  FOLLOW-UP:  . You should have an appointment to return to the office in 7-10 days after surgery.        [facility] Department Directory         [email or web address]       [email or web address]          Cardiology  Appointments: [place] - [phone] [place] - [phone]  Endocrinology  Appointments: [place] - [phone] [place] - [phone]  Gastroenterology  Appointments: [place] - [phone] [place] - [phone]        General Surgery   Appointments: [place] - [phone]  Internal Medicine/Family Medicine  Appointments: [place] - [phone] [place] - [phone] [place] - [phone]  Metabolic and Weigh Loss Surgery  Appointments: [place] - [phone]        Neurology  Appointments: [place] - [phone] [place] - [phone]  Neurosurgery  Appointments: [place] - [phone]  Obstetrics & Gynecology  Appointments: [place] - [phone] [place] - [phone]        Pediatrics  Appointments: [place] - [phone] [place] - [phone]  Physiatry  Appointments: [place] -[phone]  Physical Therapy  Appointments: [place] - [phone] [place] - [phone]        Podiatry  Appointments: [place] - [phone] [place] - [phone]  Pulmonology  Appointments: [place] - [phone]  Rheumatology  Appointments: [place] - [phone]        [place] Location: [facility]  [address] , [place]  [address]  [place] Location: [facility] [address] [place], [place]  [address]  [place] Location: [facility] [address] [place], [place]  [address]      AMBULATORY SURGERY    DISCHARGE INSTRUCTIONS   1) The drugs that you were given will stay in your system until tomorrow so for the next 24 hours you should not:  A) Drive an automobile B) Make any legal decisions C) Drink any alcoholic beverage   2) You may resume regular meals tomorrow.  Today it is better to start with liquids and gradually work up to  solid foods.  You may eat anything you prefer, but it is better to start with liquids, then soup and crackers, and gradually work up to solid foods.   3) Please notify your doctor immediately if you have any unusual bleeding, trouble breathing, redness and pain at the surgery site, drainage, fever, or pain not relieved by medication.    4) Additional Instructions:        Please contact your physician with any problems or Same Day Surgery at [phone], Monday through Friday 6 am to 4 pm, or [place] [facility] [place] [facility] number at [phone]. 

## 2020-03-17 NOTE — H&P (Signed)
The patient has been re-examined, and the chart reviewed, and there have been no interval changes to the documented history and physical.    The risks, benefits, and alternatives have been discussed at length. The patient expressed understanding of the risks benefits and agreed with plans for surgical intervention.  Mariko Nowakowski P. Jamirra Curnow, Jr. M.D.    

## 2020-03-17 NOTE — H&P (Signed)
ORTHOPAEDIC HISTORY & PHYSICAL Progress Notes Regino Bellow, PA - 03/10/2020 9:15 AM EDT Chief Complaint Chief Complaint  Patient presents with  . Right Knee - Pain  . Knee Pain  H&P-RT knee Scope-8.18.21/JPH   Reason for Visit Jordan Page is a 69 y.o. who present today for history and physical. She is to undergo a right knee arthroscopy on 03/17/2020. Was last seen in our clinic on 02/12/2020. There is been no change in her condition since that date. She is 12-1/2 years status post right total knee arthroplasty performed by Dr. Gerald Stabs. She is not using ambulatory aids. She denies any significant swelling, locking, or giving way of the knee. She does report some significant stiffness and pain to the right knee that she believes has gotten progressively worse. She is participating in water aerobics on a regular basis, typically 3 times a week. She has some difficulty with stair ambulation and getting in and out of a car. She denies any fevers, chills, erythema to the knee, or increased warmth to the knee.  Of note, the patient underwent left total knee arthroplasty in 2018 as per Dr. Burnis Kingfisher. She denies any problems with the left knee at this time.  Past Medical History Past Medical History:  Diagnosis Date  . Allergic state Seasonal allergies  . Arthritis  . Asthma without status asthmaticus, unspecified  seasonal  . Borderline diabetes  A1C 6.1%- 09/2012; diet controlled  . Borderline hyperlipidemia  LDL 134- 09/2012; diet controlled  . Diabetes mellitus type 2, uncomplicated (CMS-HCC)  pre diabetic - no medications  . GERD (gastroesophageal reflux disease)  . Obesity  . PONV (postoperative nausea and vomiting)  . Poor intravenous access  . White coat syndrome without diagnosis of hypertension 09/26/2018   Past Surgical History Past Surgical History:  Procedure Laterality Date  . ARTHROPLASTY TOTAL KNEE Left 04/17/2017  Procedure: TOTAL KNEE ARTHROPLASTY; Surgeon:  Ileene Patrick, MD; Location: [facility]; Service: Orthopedics; Laterality: Left;  . COLONOSCOPY 10/04/2009  Hyperplastic Polyp  . Endometrosis surgery  Prior to hysterectomy  . HERNIA REPAIR 1960  Right herniorrhaphy  . HYSTERECTOMY 2004  Total abdominal hysterectomy  . KNEE ARTHROSCOPY Right 2008  . Mensicus repair Right 2008  . NEPHRECTOMY  Age 9 months old  . Right total knee arthroplasty 08/27/2007  Dr. Clare Gandy Armour   Past Family History Family History  Problem Relation Age of Onset  . Alcohol abuse Father  . Diabetes type II Father  . No Known Problems Mother  . No Known Problems Brother  . Rheum arthritis Other  . Stroke Maternal Grandmother   Medications Current Outpatient Medications Ordered in Epic  Medication Sig Dispense Refill  . acetaminophen (TYLENOL) 500 MG tablet Take by mouth  . aspirin 81 MG EC tablet Take 1 tablet (81 mg total) by mouth once daily. Resume in 4 weeks  . ezetimibe (ZETIA) 10 mg tablet Take 1 tablet (10 mg total) by mouth once daily 30 tablet 3  . GLUCOSAMINE HCL/CHONDR SU A NA (OSTEO BI-FLEX ORAL) Take 1 tablet by mouth once daily.   . melatonin, bulk, 100 % Powd Take by mouth  . metFORMIN (GLUCOPHAGE-XR) 500 MG XR tablet Take 1 tablet (500 mg total) by mouth 2 (two) times daily with meals 180 tablet 1  . MULTIVITAMIN ORAL Take 1 tablet by mouth once daily.  Marland Kitchen olopatadine HCl (PATADAY OPHTH) Apply to eye 1 drop daily  . omeprazole (PRILOSEC OTC) 20 MG EC tablet Take 1 tablet (  20 mg total) by mouth once daily 90 tablet 1   No current Epic-ordered facility-administered medications on file.   Allergies Allergies  Allergen Reactions  . Codeine Nausea and Vomiting  . Lovastatin Nausea and Abdominal Pain  . Pravastatin Muscle Pain    Review of Systems A comprehensive 14 point ROS was performed, reviewed, and the pertinent orthopaedic findings are documented in the HPI.  Exam BP 130/82  Ht 149.9 cm (4\' 11" )  Wt 83.7 kg (184 lb 9.6  oz)  BMI 37.28 kg/m   General: Well-developed well-nourished female seen in no acute distress.   HEENT: Atraumatic,normocephalic. Pupils are equal and reactive to light. Oropharynx is clear with moist mucosa  Lungs: Clear to auscultation bilaterally   Cardiovascular: Regular rate and rhythm. Normal S1, S2. No murmurs. No appreciable gallops or rubs. Peripheral pulses are palpable.  Abdomen: Soft, non-tender, nondistended. Bowel sounds present  Extremity: Good strength, stability, and range of motion of the upper extremities. Good range of motion of the hips and ankles.  Right Knee: Soft tissue swelling: none Effusion: none Erythema: none Crepitance: none Tenderness: none Alignment: normal Mediolateral laxity: stable Patellar tracking: Good tracking without evidence of subluxation or tilt Atrophy: No significant atrophy. Quadriceps tone was fair to good. Range of motion: 0/8/61 degrees   Neurological:  The patient is alert and oriented Sensation to light touch appears to be intact and within normal limits Gross motor strength appeared to be equal to 5/5  Vascular :  Peripheral pulses felt to be palpable. Capillary refill appears to be intact and within normal limits No swelling or edema to the lower extremities  X-ray  1. AP standing, lateral and sunrise view of the right knee taken on 02/12/2020 showed good position of the total knee implants. Good alignment is noted on the AP view. Good cement mantle is appreciated without evidence of loosening. No evidence of polyethylene wear or osteolysis. No evidence of fracture or dislocation.   Impression  1. Right total knee arthroplasty 2. Arthrofibrosis right knee  Plan   1. I did discuss with the patient her procedure as well as postop course 2. Patient is scheduled to begin outpatient therapy on 03/18/2020 3. Return to clinic 1 week postop 4. Discussed wound care postop  This note was generated in part with  voice recognition software and I apologize for any typographical errors that were not detected and corrected   Watt Climes PA  Electronically signed by Regino Bellow, PA at 03/10/2020 10:05 AM EDT

## 2020-03-17 NOTE — Anesthesia Preprocedure Evaluation (Signed)
Anesthesia Evaluation  Patient identified by MRN, date of birth, ID band Patient awake    Reviewed: Allergy & Precautions, H&P , NPO status , Patient's Chart, lab work & pertinent test results, reviewed documented beta blocker date and time   History of Anesthesia Complications (+) PONV and history of anesthetic complications  Airway Mallampati: I  TM Distance: >3 FB Neck ROM: full    Dental  (+) Dental Advidsory Given, Teeth Intact   Pulmonary neg shortness of breath, asthma , neg sleep apnea, neg COPD, neg recent URI, former smoker,    Pulmonary exam normal breath sounds clear to auscultation       Cardiovascular Exercise Tolerance: Good negative cardio ROS Normal cardiovascular exam Rhythm:regular Rate:Normal     Neuro/Psych negative neurological ROS  negative psych ROS   GI/Hepatic Neg liver ROS, GERD  ,  Endo/Other  diabetes  Renal/GU negative Renal ROS  negative genitourinary   Musculoskeletal   Abdominal   Peds  Hematology negative hematology ROS (+)   Anesthesia Other Findings Past Medical History: No date: Diabetes mellitus without complication (HCC) No date: GERD (gastroesophageal reflux disease)   Reproductive/Obstetrics negative OB ROS                             Anesthesia Physical Anesthesia Plan  ASA: III  Anesthesia Plan: General   Post-op Pain Management:    Induction: Intravenous  PONV Risk Score and Plan: 4 or greater and Ondansetron, Dexamethasone and Treatment may vary due to age or medical condition  Airway Management Planned: LMA  Additional Equipment:   Intra-op Plan:   Post-operative Plan: Extubation in OR  Informed Consent: I have reviewed the patients History and Physical, chart, labs and discussed the procedure including the risks, benefits and alternatives for the proposed anesthesia with the patient or authorized representative who has  indicated his/her understanding and acceptance.     Dental Advisory Given  Plan Discussed with: Anesthesiologist, CRNA and Surgeon  Anesthesia Plan Comments:         Anesthesia Quick Evaluation

## 2020-03-18 ENCOUNTER — Encounter: Payer: Self-pay | Admitting: Orthopedic Surgery

## 2020-03-18 DIAGNOSIS — M25561 Pain in right knee: Secondary | ICD-10-CM | POA: Diagnosis not present

## 2020-03-18 NOTE — Anesthesia Postprocedure Evaluation (Signed)
Anesthesia Post Note  Patient: Jordan Page  Procedure(s) Performed: RIGHT KNEE ARTHROSCOPY WITH WRTHROSCOPIC LYSIS OF ADHESIONS (Right Knee)  Patient location during evaluation: PACU Anesthesia Type: General Level of consciousness: awake and alert Pain management: pain level controlled Vital Signs Assessment: post-procedure vital signs reviewed and stable Respiratory status: spontaneous breathing, nonlabored ventilation, respiratory function stable and patient connected to nasal cannula oxygen Cardiovascular status: blood pressure returned to baseline and stable Postop Assessment: no apparent nausea or vomiting Anesthetic complications: no   No complications documented.   Last Vitals:  Vitals:   03/17/20 2056 03/17/20 2111  BP: (!) 147/69 (!) 141/70  Pulse:    Resp: 20 17  Temp:    SpO2: 96% 96%    Last Pain:  Vitals:   03/17/20 2111  TempSrc:   PainSc: 3                  Arita Miss

## 2020-03-22 DIAGNOSIS — M25661 Stiffness of right knee, not elsewhere classified: Secondary | ICD-10-CM | POA: Diagnosis not present

## 2020-03-24 DIAGNOSIS — M25661 Stiffness of right knee, not elsewhere classified: Secondary | ICD-10-CM | POA: Diagnosis not present

## 2020-03-26 DIAGNOSIS — M25661 Stiffness of right knee, not elsewhere classified: Secondary | ICD-10-CM | POA: Diagnosis not present

## 2020-03-29 DIAGNOSIS — M25661 Stiffness of right knee, not elsewhere classified: Secondary | ICD-10-CM | POA: Diagnosis not present

## 2020-03-31 DIAGNOSIS — M25661 Stiffness of right knee, not elsewhere classified: Secondary | ICD-10-CM | POA: Diagnosis not present

## 2020-04-02 DIAGNOSIS — M25661 Stiffness of right knee, not elsewhere classified: Secondary | ICD-10-CM | POA: Diagnosis not present

## 2020-04-07 DIAGNOSIS — M25661 Stiffness of right knee, not elsewhere classified: Secondary | ICD-10-CM | POA: Diagnosis not present

## 2020-04-13 DIAGNOSIS — M25661 Stiffness of right knee, not elsewhere classified: Secondary | ICD-10-CM | POA: Diagnosis not present

## 2020-04-15 DIAGNOSIS — M25661 Stiffness of right knee, not elsewhere classified: Secondary | ICD-10-CM | POA: Diagnosis not present

## 2020-04-22 DIAGNOSIS — M25661 Stiffness of right knee, not elsewhere classified: Secondary | ICD-10-CM | POA: Diagnosis not present

## 2020-04-27 DIAGNOSIS — M25571 Pain in right ankle and joints of right foot: Secondary | ICD-10-CM | POA: Diagnosis not present

## 2020-04-27 DIAGNOSIS — E785 Hyperlipidemia, unspecified: Secondary | ICD-10-CM | POA: Diagnosis not present

## 2020-04-27 DIAGNOSIS — M25471 Effusion, right ankle: Secondary | ICD-10-CM | POA: Diagnosis not present

## 2020-04-27 DIAGNOSIS — E1169 Type 2 diabetes mellitus with other specified complication: Secondary | ICD-10-CM | POA: Diagnosis not present

## 2020-04-27 DIAGNOSIS — E7800 Pure hypercholesterolemia, unspecified: Secondary | ICD-10-CM | POA: Diagnosis not present

## 2020-04-29 DIAGNOSIS — M25661 Stiffness of right knee, not elsewhere classified: Secondary | ICD-10-CM | POA: Diagnosis not present

## 2020-05-03 DIAGNOSIS — E782 Mixed hyperlipidemia: Secondary | ICD-10-CM | POA: Diagnosis not present

## 2020-05-03 DIAGNOSIS — M25661 Stiffness of right knee, not elsewhere classified: Secondary | ICD-10-CM | POA: Diagnosis not present

## 2020-05-03 DIAGNOSIS — G720 Drug-induced myopathy: Secondary | ICD-10-CM | POA: Diagnosis not present

## 2020-05-03 DIAGNOSIS — E1169 Type 2 diabetes mellitus with other specified complication: Secondary | ICD-10-CM | POA: Diagnosis not present

## 2020-05-03 DIAGNOSIS — E6609 Other obesity due to excess calories: Secondary | ICD-10-CM | POA: Diagnosis not present

## 2020-05-03 DIAGNOSIS — T466X5A Adverse effect of antihyperlipidemic and antiarteriosclerotic drugs, initial encounter: Secondary | ICD-10-CM | POA: Diagnosis not present

## 2020-05-03 DIAGNOSIS — Z23 Encounter for immunization: Secondary | ICD-10-CM | POA: Diagnosis not present

## 2020-05-03 DIAGNOSIS — E785 Hyperlipidemia, unspecified: Secondary | ICD-10-CM | POA: Diagnosis not present

## 2020-05-18 DIAGNOSIS — M25661 Stiffness of right knee, not elsewhere classified: Secondary | ICD-10-CM | POA: Diagnosis not present

## 2020-06-08 DIAGNOSIS — M25661 Stiffness of right knee, not elsewhere classified: Secondary | ICD-10-CM | POA: Diagnosis not present

## 2020-07-13 DIAGNOSIS — E113393 Type 2 diabetes mellitus with moderate nonproliferative diabetic retinopathy without macular edema, bilateral: Secondary | ICD-10-CM | POA: Diagnosis not present

## 2023-01-01 ENCOUNTER — Other Ambulatory Visit: Payer: Self-pay

## 2023-01-01 DIAGNOSIS — R079 Chest pain, unspecified: Secondary | ICD-10-CM

## 2023-01-01 DIAGNOSIS — R0602 Shortness of breath: Secondary | ICD-10-CM

## 2023-01-04 ENCOUNTER — Ambulatory Visit
Admission: RE | Admit: 2023-01-04 | Discharge: 2023-01-04 | Disposition: A | Discharge status: Home / Self Care | Payer: PPO | Source: Ambulatory Visit | Attending: Internal Medicine | Admitting: Internal Medicine

## 2023-01-04 DIAGNOSIS — R079 Chest pain, unspecified: Secondary | ICD-10-CM | POA: Insufficient documentation

## 2023-01-04 DIAGNOSIS — R0602 Shortness of breath: Secondary | ICD-10-CM | POA: Insufficient documentation

## 2023-06-06 ENCOUNTER — Ambulatory Visit: Payer: Medicare Other

## 2023-06-06 DIAGNOSIS — K449 Diaphragmatic hernia without obstruction or gangrene: Secondary | ICD-10-CM | POA: Diagnosis not present

## 2023-06-06 DIAGNOSIS — K219 Gastro-esophageal reflux disease without esophagitis: Secondary | ICD-10-CM | POA: Diagnosis present

## 2023-06-11 ENCOUNTER — Encounter: Payer: Self-pay | Admitting: Gastroenterology

## 2023-06-12 ENCOUNTER — Encounter: Payer: Self-pay | Admitting: Gastroenterology

## 2023-06-15 ENCOUNTER — Other Ambulatory Visit: Payer: Self-pay | Admitting: Gastroenterology

## 2023-06-15 DIAGNOSIS — K219 Gastro-esophageal reflux disease without esophagitis: Secondary | ICD-10-CM
